# Patient Record
Sex: Male | Born: 1985 | Race: Black or African American | Hispanic: No | Marital: Single | Smoking: Current every day smoker
Health system: Southern US, Community
[De-identification: ages and names within clinical notes are randomized; demographics above are authoritative.]

---
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
## 2010-08-17 ENCOUNTER — Emergency Department (HOSPITAL_COMMUNITY)
Admission: EM | Admit: 2010-08-17 | Discharge: 2010-08-18 | Disposition: A | Discharge status: Home / Self Care | Payer: Managed Care, Other (non HMO) | Attending: Emergency Medicine | Admitting: Emergency Medicine

## 2010-08-17 DIAGNOSIS — K089 Disorder of teeth and supporting structures, unspecified: Secondary | ICD-10-CM | POA: Insufficient documentation

## 2010-08-17 DIAGNOSIS — K029 Dental caries, unspecified: Secondary | ICD-10-CM | POA: Insufficient documentation

## 2018-02-11 ENCOUNTER — Emergency Department (HOSPITAL_COMMUNITY)
Admission: EM | Admit: 2018-02-11 | Discharge: 2018-02-11 | Disposition: A | Discharge status: Home / Self Care | Payer: Medicaid Other | Attending: Emergency Medicine | Admitting: Emergency Medicine

## 2018-02-11 ENCOUNTER — Emergency Department (HOSPITAL_COMMUNITY): Payer: Medicaid Other

## 2018-02-11 ENCOUNTER — Encounter (HOSPITAL_COMMUNITY): Payer: Self-pay

## 2018-02-11 DIAGNOSIS — R51 Headache: Secondary | ICD-10-CM | POA: Diagnosis not present

## 2018-02-11 DIAGNOSIS — F17200 Nicotine dependence, unspecified, uncomplicated: Secondary | ICD-10-CM | POA: Insufficient documentation

## 2018-02-11 DIAGNOSIS — R05 Cough: Secondary | ICD-10-CM | POA: Diagnosis not present

## 2018-02-11 DIAGNOSIS — R509 Fever, unspecified: Secondary | ICD-10-CM | POA: Diagnosis present

## 2018-02-11 DIAGNOSIS — M7918 Myalgia, other site: Secondary | ICD-10-CM | POA: Insufficient documentation

## 2018-02-11 DIAGNOSIS — R6889 Other general symptoms and signs: Secondary | ICD-10-CM

## 2018-02-11 DIAGNOSIS — R109 Unspecified abdominal pain: Secondary | ICD-10-CM | POA: Diagnosis not present

## 2018-02-11 DIAGNOSIS — J111 Influenza due to unidentified influenza virus with other respiratory manifestations: Secondary | ICD-10-CM | POA: Diagnosis not present

## 2018-02-11 LAB — INFLUENZA PANEL BY PCR (TYPE A & B)
INFLAPCR: NEGATIVE
INFLBPCR: NEGATIVE

## 2018-02-11 MED ORDER — BENZONATATE 100 MG PO CAPS
100.0000 mg | ORAL_CAPSULE | Freq: Once | ORAL | Status: AC
  Administered 2018-02-11: 100 mg via ORAL
  Filled 2018-02-11: qty 1

## 2018-02-11 MED ORDER — OSELTAMIVIR PHOSPHATE 75 MG PO CAPS: 75.0000 mg | ORAL_CAPSULE | Freq: Two times a day (BID) | ORAL | 0 refills | Status: DC

## 2018-02-11 MED ORDER — IBUPROFEN 800 MG PO TABS
800.0000 mg | ORAL_TABLET | Freq: Once | ORAL | Status: AC
  Administered 2018-02-11: 800 mg via ORAL
  Filled 2018-02-11: qty 1

## 2018-02-11 MED ORDER — BENZONATATE 100 MG PO CAPS: 100.0000 mg | ORAL_CAPSULE | Freq: Three times a day (TID) | ORAL | 0 refills | Status: DC

## 2018-02-11 NOTE — ED Notes (Signed)
Patient transported to X-ray 

## 2018-02-11 NOTE — ED Notes (Signed)
Pt stable, ambulatory, states understanding of discharge instructions 

## 2018-02-11 NOTE — Discharge Instructions (Signed)
Your symptoms are suggestive of flu.  Take tamiflu as prescribed, take tessalon for cough.  Drink plenty of fluid, alternate between tylenol and ibuprofen for fever and body aches.

## 2018-02-11 NOTE — ED Triage Notes (Signed)
Pt reports yesterday started headache, body pains, dry cough and runny nose

## 2018-02-11 NOTE — ED Provider Notes (Signed)
[facility] Note   CSN: [id number] Arrival date & time: 02/11/18  1930     History   Chief Complaint No chief complaint on file.   HPI Dennis Tran is a 32 y.o. male.  The history is provided by the patient. No language interpreter was used.     Patient reports since yesterday he developed fever, chills, headache, body aches, dry cough, sinus congestion and abdominal discomfort.  Symptom is moderate in severity not improving with home remedy.  He denies any recent sick contact.  He has not had his flu shot.  He denies neck stiffness, nausea vomiting diarrhea or dysuria or rash.  He did not travel outside the country.  He is a smoker.  History reviewed. No pertinent past medical history.  There are no active problems to display for this patient.   History reviewed. No pertinent surgical history.      Home Medications    Prior to Admission medications   Not on File    Family History History reviewed. No pertinent family history.  Social History Social History   Tobacco Use  . Smoking status: Current Every Day Smoker  . Smokeless tobacco: Never Used  Substance Use Topics  . Alcohol use: Never    Frequency: Never  . Drug use: Never     Allergies   Patient has no known allergies.   Review of Systems Review of Systems  All other systems reviewed and are negative.    Physical Exam Updated Vital Signs BP (!) 145/83 (BP Location: Right Arm)   Pulse (!) 113   Temp 99.3 F (37.4 C) (Oral)   Resp 17   Ht 6' (1.829 m)   Wt 90.7 kg   SpO2 97%   BMI 27.12 kg/m   Physical Exam  Constitutional: He is oriented to person, place, and time. He appears well-developed and well-nourished. No distress.  HENT:  Head: Atraumatic.  Right Ear: External ear normal.  Left Ear: External ear normal.  Nose: Nose normal.  Mouth/Throat: Oropharynx is clear and moist.  Eyes: Conjunctivae are normal.  Neck: Normal range  of motion. Neck supple.  No nuchal rigidity  Cardiovascular:  Tachycardia without murmur rubs or gallops  Pulmonary/Chest: No stridor. He has no wheezes.  Scattered rhonchi heard  Abdominal: Soft.  Neurological: He is alert and oriented to person, place, and time.  Skin: No rash noted.  Psychiatric: He has a normal mood and affect.  Nursing note and vitals reviewed.    ED Treatments / Results  Labs (all labs ordered are listed, but only abnormal results are displayed) Labs Reviewed  INFLUENZA PANEL BY PCR (TYPE A & B)    EKG None  Radiology Dg Chest 2 View  Result Date: 02/11/2018 CLINICAL DATA:  Cough, fever, body aches and chest pain. EXAM: CHEST - 2 VIEW COMPARISON:  None. FINDINGS: Cardiomediastinal silhouette is normal. No pleural effusions or focal consolidations. Trachea projects midline and there is no pneumothorax. Soft tissue planes and included osseous structures are non-suspicious. IMPRESSION: Negative. Electronically Signed   By: Awilda Metroourtnay  Bloomer M.D.   On: 02/11/2018 20:39    Procedures Procedures (including critical care time)  Medications Ordered in ED Medications  ibuprofen (ADVIL,MOTRIN) tablet 800 mg (800 mg Oral Given 02/11/18 2013)  benzonatate (TESSALON) capsule 100 mg (100 mg Oral Given 02/11/18 2013)     Initial Impression / Assessment and Plan / ED Course  I have reviewed the triage vital signs and  the nursing notes.  Pertinent labs & imaging results that were available during my care of the patient were reviewed by me and considered in my medical decision making (see chart for details).     BP (!) 145/83 (BP Location: Right Arm)   Pulse (!) 113   Temp 99.3 F (37.4 C) (Oral)   Resp 17   Ht 6' (1.829 m)   Wt 90.7 kg   SpO2 97%   BMI 27.12 kg/m    Final Clinical Impressions(s) / ED Diagnoses   Final diagnoses:  Flu-like symptoms    ED Discharge Orders         Ordered    oseltamivir (TAMIFLU) 75 MG capsule  Every 12 hours       02/11/18 2128    benzonatate (TESSALON) 100 MG capsule  Every 8 hours     02/11/18 2128         8:09 PM Patient here with flulike symptoms. Flu testing sent, CXR ordered.  9:30 PM CXR unremarkable, since pt is in the window of treatment, will prescribe tamiflu.    Fayrene Helper, PA-C 02/11/18 2130    Raeford Razor, MD 02/12/18 1259

## 2018-12-30 ENCOUNTER — Ambulatory Visit (INDEPENDENT_AMBULATORY_CARE_PROVIDER_SITE_OTHER): Payer: Medicaid Other

## 2018-12-30 ENCOUNTER — Encounter (HOSPITAL_COMMUNITY): Payer: Self-pay | Admitting: Emergency Medicine

## 2018-12-30 ENCOUNTER — Other Ambulatory Visit: Payer: Self-pay

## 2018-12-30 ENCOUNTER — Ambulatory Visit (HOSPITAL_COMMUNITY)
Admission: EM | Admit: 2018-12-30 | Discharge: 2018-12-30 | Disposition: A | Discharge status: Home / Self Care | Payer: Medicaid Other | Attending: Family Medicine | Admitting: Family Medicine

## 2018-12-30 DIAGNOSIS — M25511 Pain in right shoulder: Secondary | ICD-10-CM

## 2018-12-30 NOTE — ED Triage Notes (Signed)
Pt sts right arm pain after getting splinter in right leg and having some swelling and pain

## 2018-12-30 NOTE — ED Provider Notes (Signed)
[facility]    CSN: [id number] Arrival date & time: 12/30/18  1456      History   Chief Complaint Chief Complaint  Patient presents with  . Arm Pain    HPI Dennis Tran is a 33 y.o. male.   Patient is a 33 year old male with no significant past medical history.  He presents today with right shoulder pain.  This started after arm was forced back by a 50 pound roll of plastic wrap.  Symptoms have been constant, waxing and waning.  Limited range of motion of shoulder.  No swelling of the shoulder or deformities.  He has been taken Tylenol with some relief of the pain.  Denies any radiation of pain, numbness, tingling or weakness in the extremity.  ROS per HPI    Arm Pain    History reviewed. No pertinent past medical history.  There are no active problems to display for this patient.   History reviewed. No pertinent surgical history.     Home Medications    Prior to Admission medications   Not on File    Family History Family History  Problem Relation Age of Onset  . Healthy Mother     Social History Social History   Tobacco Use  . Smoking status: Current Every Day Smoker  . Smokeless tobacco: Never Used  Substance Use Topics  . Alcohol use: Never    Frequency: Never  . Drug use: Never     Allergies   Patient has no known allergies.   Review of Systems Review of Systems   Physical Exam Triage Vital Signs ED Triage Vitals  Enc Vitals Group     BP 12/30/18 1518 122/77     Pulse Rate 12/30/18 1518 75     Resp 12/30/18 1518 18     Temp 12/30/18 1518 98.1 F (36.7 C)     Temp Source 12/30/18 1518 Oral     SpO2 12/30/18 1518 99 %     Weight --      Height --      Head Circumference --      Peak Flow --      Pain Score 12/30/18 1519 5     Pain Loc --      Pain Edu? --      Excl. in GC? --    No data found.  Updated Vital Signs BP 122/77 (BP Location: Left Arm)   Pulse 75   Temp 98.1 F (36.7 C) (Oral)   Resp 18    SpO2 99%   Visual Acuity Right Eye Distance:   Left Eye Distance:   Bilateral Distance:    Right Eye Near:   Left Eye Near:    Bilateral Near:     Physical Exam Vitals signs and nursing note reviewed.  Constitutional:      Appearance: Normal appearance.  HENT:     Head: Normocephalic and atraumatic.     Nose: Nose normal.  Eyes:     Conjunctiva/sclera: Conjunctivae normal.  Neck:     Musculoskeletal: Normal range of motion.  Pulmonary:     Effort: Pulmonary effort is normal.  Musculoskeletal: Normal range of motion.        General: No swelling.     Comments: Mild TTP to right anterior shoulder. No swelling.  Good strength in extremity with strong radial pulse.  Normal temperature and color. Able to wiggle digits  Skin:    General: Skin is warm and dry.  Findings: No rash.  Neurological:     General: No focal deficit present.     Mental Status: He is alert.  Psychiatric:        Mood and Affect: Mood normal.      UC Treatments / Results  Labs (all labs ordered are listed, but only abnormal results are displayed) Labs Reviewed - No data to display  EKG   Radiology Dg Shoulder Right  Result Date: 12/30/2018 CLINICAL DATA:  Per pt: three weeks ago, at work, a 50lb roll of plastic wrap was moving it and the right shoulder caught all the weight of the roll. Has been having pain in the right shoulder since. EXAM: RIGHT SHOULDER - 2+ VIEW COMPARISON:  None. FINDINGS: There is no evidence of fracture or dislocation. There is no evidence of arthropathy or other focal bone abnormality. Soft tissues are unremarkable. Partially visualized adjacent right lung and ribs are unremarkable. IMPRESSION: Negative right shoulder radiographs. Electronically Signed   By: Audie Pinto M.D.   On: 12/30/2018 16:19    Procedures Procedures (including critical care time)  Medications Ordered in UC Medications - No data to display  Initial Impression / Assessment and Plan / UC  Course  I have reviewed the triage vital signs and the nursing notes.  Pertinent labs & imaging results that were available during my care of the patient were reviewed by me and considered in my medical decision making (see chart for details).     Right shoulder pain-  X-ray negative most likely shoulder strain or ligamentous tear.  Most likely heal on its own with time. He can continue the Tylenol or take ibuprofen for pain. Recommended if symptoms do not improve he will need to follow-up with orthopedics. Final Clinical Impressions(s) / UC Diagnoses   Final diagnoses:  Acute pain of right shoulder     Discharge Instructions     Your x ray was normal You can continue the tylenol You can also try ibuprofen as needed.  Rest, ice the shoulder If not better in the next few weeks you may want to see orthopedics.  Could be a small rotator cuff tear. These usually resolve on their own in 6 weeks.     ED Prescriptions    None     PDMP not reviewed this encounter.   Orvan July, NP 12/30/18 1653

## 2018-12-30 NOTE — Discharge Instructions (Signed)
Your x ray was normal You can continue the tylenol You can also try ibuprofen as needed.  Rest, ice the shoulder If not better in the next few weeks you may want to see orthopedics.  Could be a small rotator cuff tear. These usually resolve on their own in 6 weeks.

## 2020-05-31 IMAGING — CR DG CHEST 2V
2 series · 2 of 2 positions shown · non-contrast
Comparison: None.

CLINICAL DATA: Cough, fever, body aches and chest pain.

EXAM:
CHEST - 2 VIEW

[chest pa]
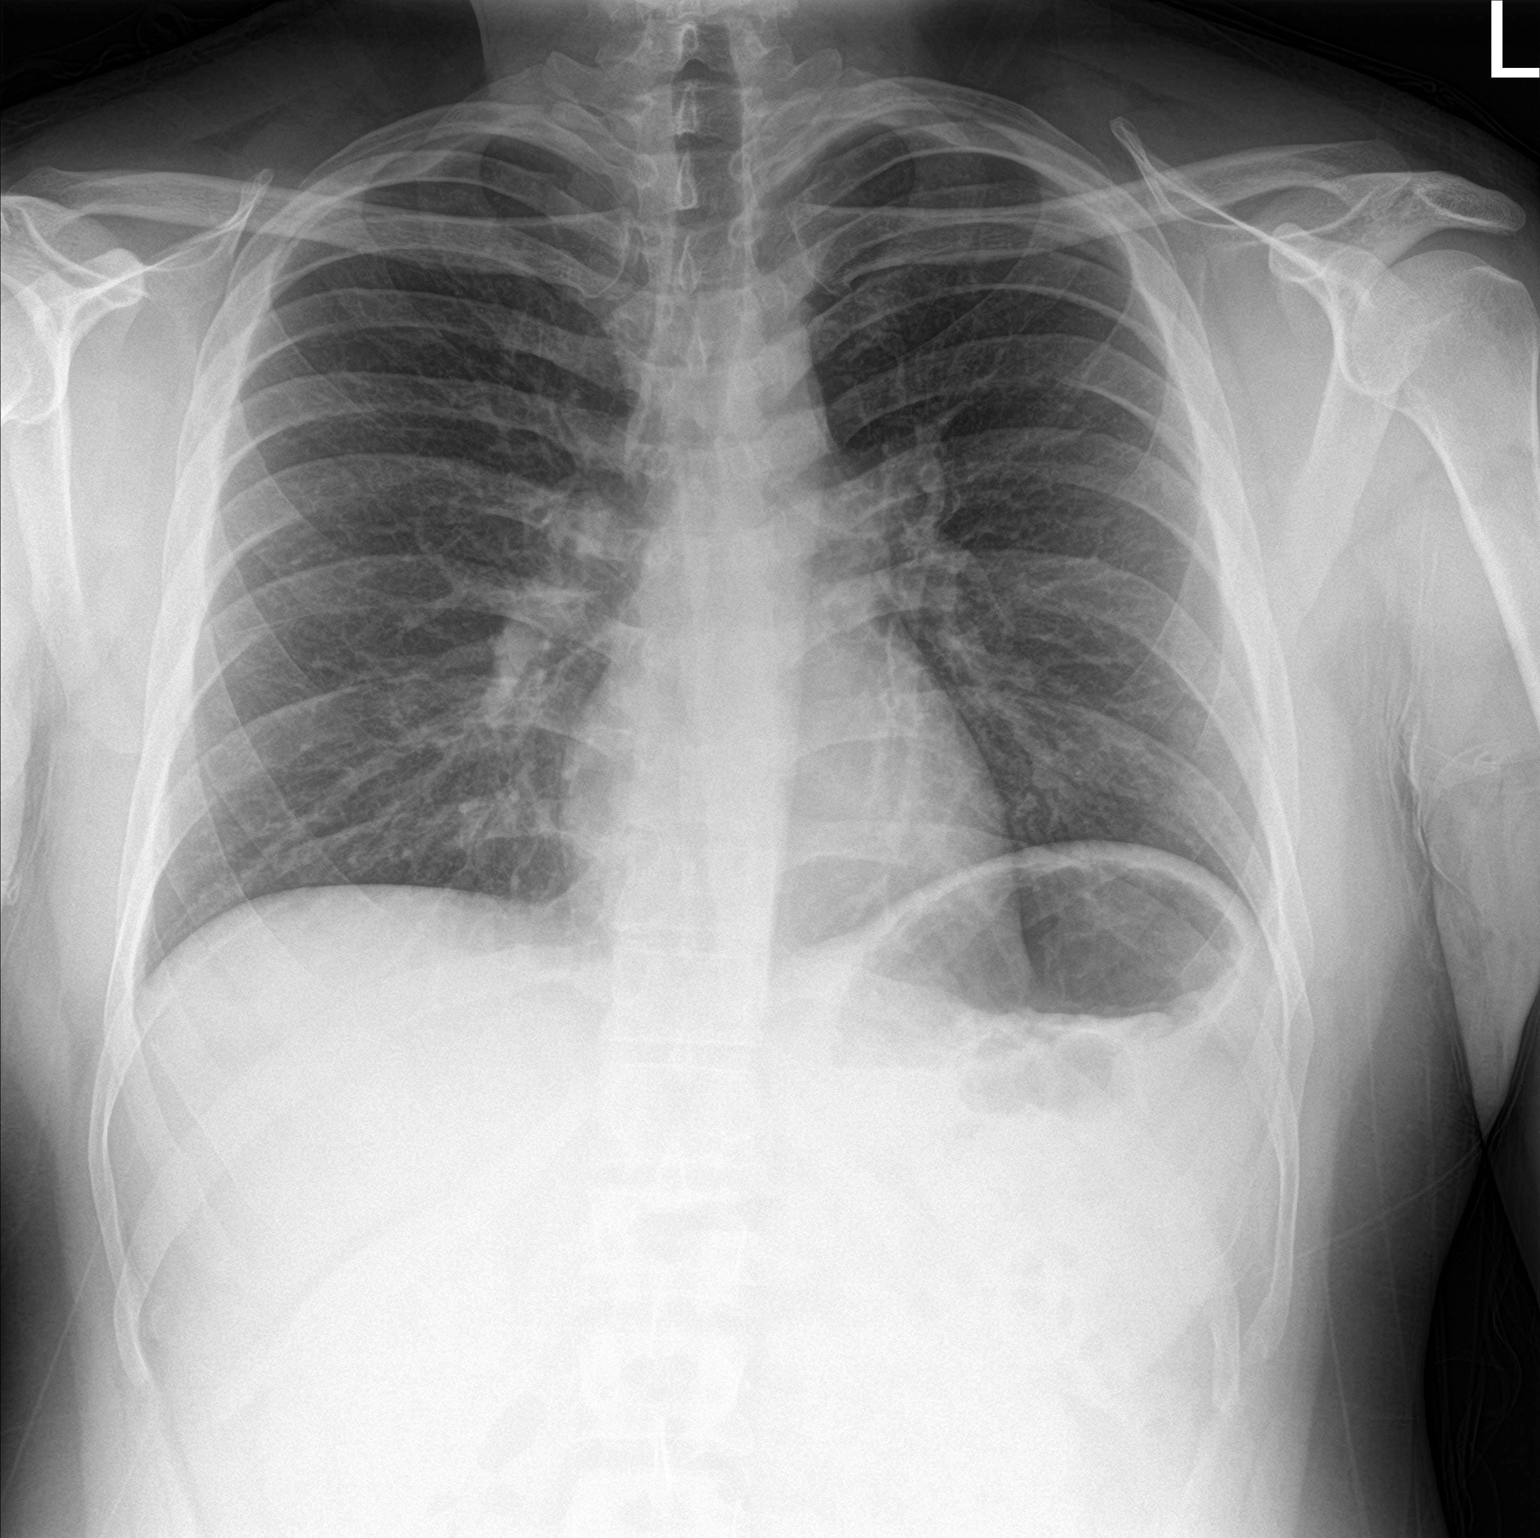

[chest lat]
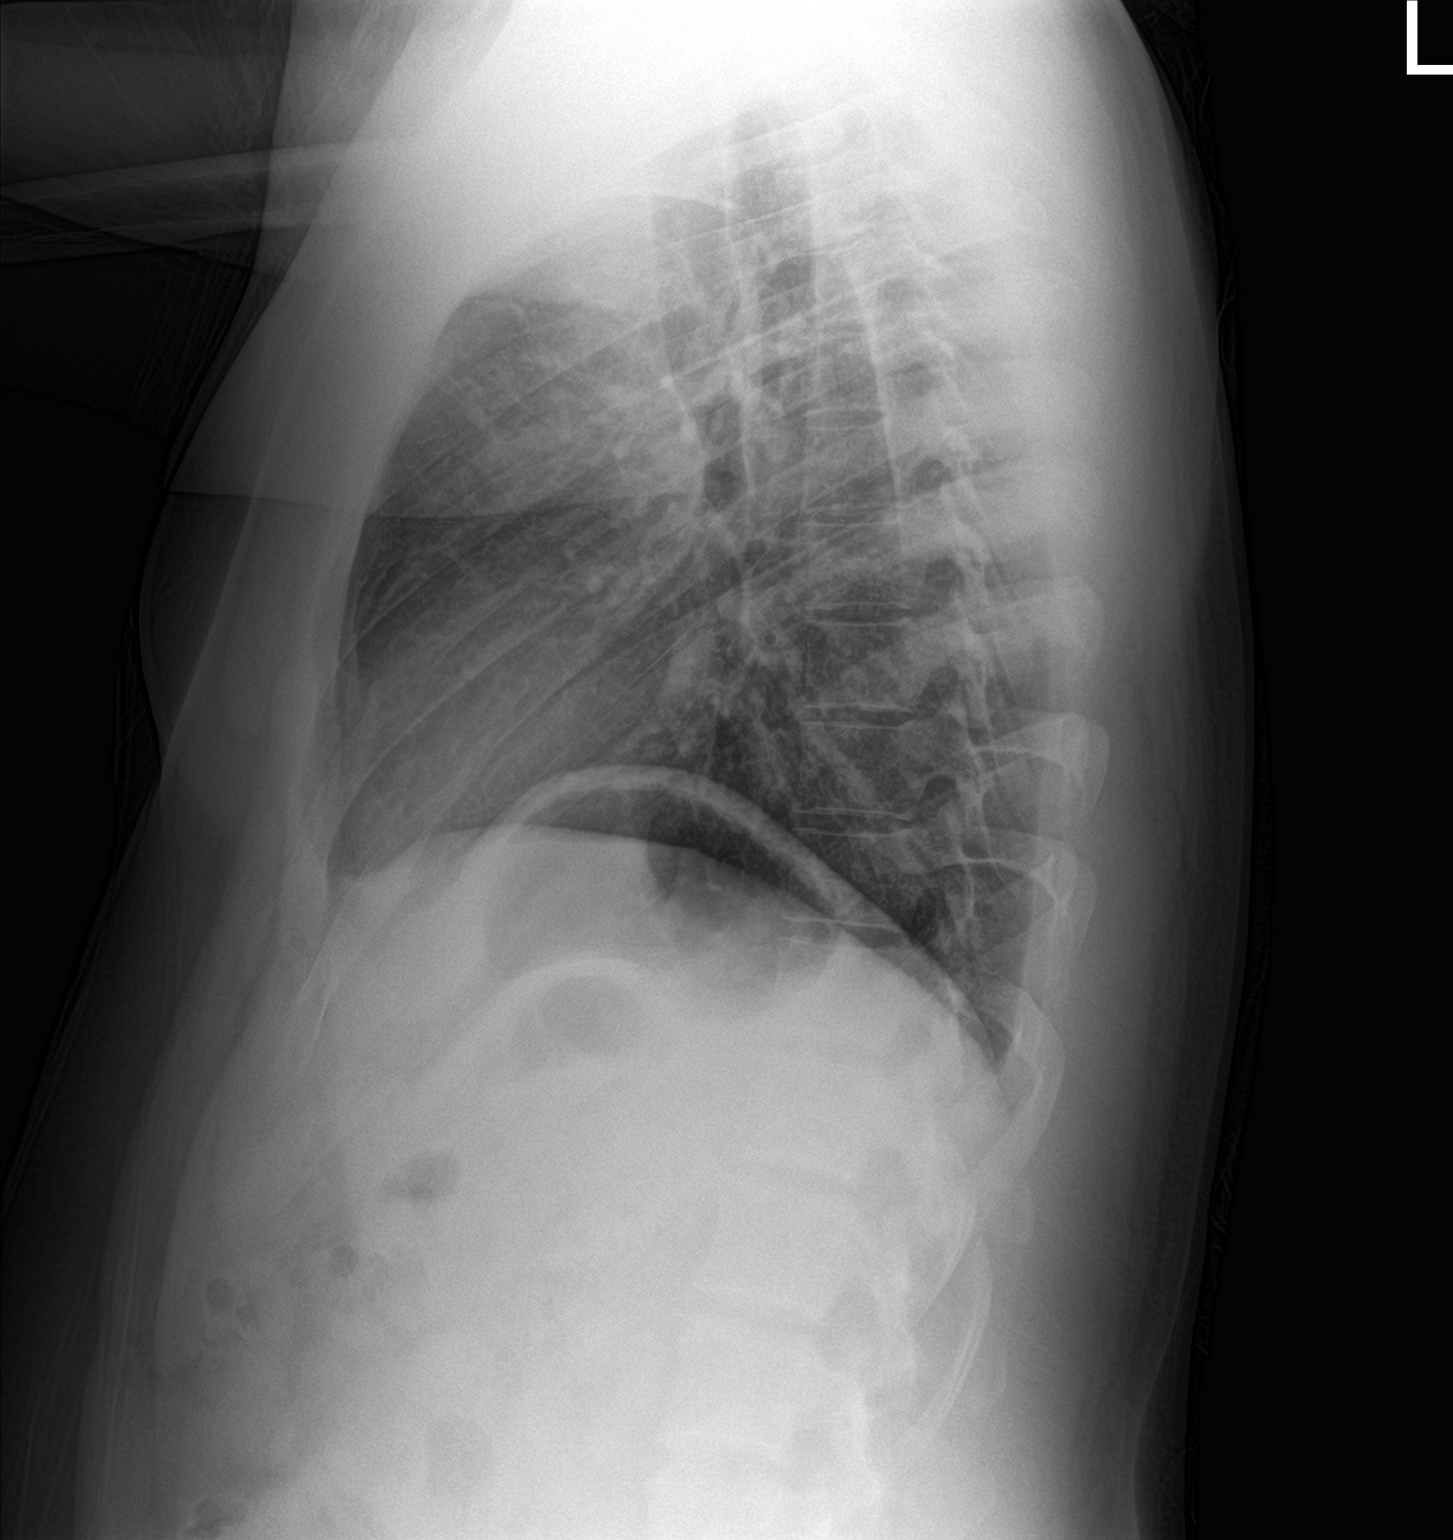

[2 of 2 positions shown; findings below may reference images not displayed]

FINDINGS: Cardiomediastinal silhouette is normal. No pleural effusions or
focal consolidations. Trachea projects midline and there is no
pneumothorax. Soft tissue planes and included osseous structures are
non-suspicious.
IMPRESSION: Negative.

## 2020-09-25 ENCOUNTER — Other Ambulatory Visit: Payer: Self-pay

## 2020-09-25 ENCOUNTER — Encounter (INDEPENDENT_AMBULATORY_CARE_PROVIDER_SITE_OTHER): Payer: Self-pay | Admitting: Ophthalmology

## 2020-09-25 ENCOUNTER — Ambulatory Visit (INDEPENDENT_AMBULATORY_CARE_PROVIDER_SITE_OTHER): Payer: Medicaid Other | Admitting: Ophthalmology

## 2020-09-25 DIAGNOSIS — H33322 Round hole, left eye: Secondary | ICD-10-CM

## 2020-09-25 DIAGNOSIS — H3581 Retinal edema: Secondary | ICD-10-CM | POA: Diagnosis not present

## 2020-09-25 DIAGNOSIS — H35412 Lattice degeneration of retina, left eye: Secondary | ICD-10-CM | POA: Diagnosis not present

## 2020-09-25 MED ORDER — PREDNISOLONE ACETATE 1 % OP SUSP: 1.0000 [drp] | Freq: Four times a day (QID) | OPHTHALMIC | 0 refills | Status: AC

## 2020-09-25 NOTE — Progress Notes (Signed)
[facility] - Clinic Note  09/25/2020     CHIEF COMPLAINT Patient presents for Retina Evaluation   HISTORY OF PRESENT ILLNESS: Dennis Tran is a 35 y.o. male who presents to the clinic today for:   HPI     Retina Evaluation   In left eye.  This started 3 months ago.  Duration of 3 months.  Associated Symptoms Floaters.  Context:  distance vision and near vision.  I, the attending physician,  performed the HPI with the patient and updated documentation appropriately.        Comments   Pt went in for routine eye exam yesterday with Dr. Dione Booze and states they found something in his left eye.  Pt reports 2 year history of floaters OS that have gotten increasingly worse x 3 months.  Pt states he sees much better out of his contact lenses than glasses--has not filled Rx for glasses from Dr. Conley Rolls from 06/2020.  Pt states his prescription fluctuates a lot.  Pt states Dr. Laruth Bouchard office checked MRx but did not provide an updated Rx yesterday. 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  States he is always extremely light sensitive.  Pt wears sunglasses but wants to discuss possibility of having windows tinted in vehicle to reduce sun exposure.   Pt was on QHS gtt for glaucoma approx 2 months ago (has not used in 2 months.  Reports teal top--?latanoprost).  Pt states at time of treating glaucoma, he was only asked to use drop in the left eye.  Pt has +Fam Hx of glaucoma in siblings.      Last edited by Rennis Chris, MD on 09/25/2020  1:07 PM.    Pt is here on the referral of Dr. Dione Booze for concern of retinal tear OS, pt saw Dr. Dione Booze for a glaucoma exam, pt previously saw Dr. Wynelle Link for glaucoma, but he no longer sees her due to insurance reason, pt states she put him on latanoprost QHS, pt ran out of latanoprost 2 months ago, so he states Dr. Dione Booze is unsure if he actually has glaucoma or not, pt has no other health or eye problems, pt states he has noticed floaters x2  months  Referring physician: Olivia Canter, MD [address] [place],  [place] [address]  HISTORICAL INFORMATION:   Selected notes from the MEDICAL RECORD NUMBER Referred by Dr. Laruth Bouchard for lattice with holes OS LEE:  Ocular Hx- PMH-    CURRENT MEDICATIONS: Current Outpatient Medications (Ophthalmic Drugs)  Medication Sig   prednisoLONE acetate (PRED FORTE) 1 % ophthalmic suspension Place 1 drop into the left eye 4 (four) times daily for 7 days.   No current facility-administered medications for this visit. (Ophthalmic Drugs)   No current outpatient medications on file. (Other)   No current facility-administered medications for this visit. (Other)      REVIEW OF SYSTEMS: ROS   Positive for: Eyes Negative for: Constitutional, Gastrointestinal, Neurological, Skin, Genitourinary, Musculoskeletal, HENT, Endocrine, Cardiovascular, Respiratory, Psychiatric, Allergic/Imm, Heme/Lymph Last edited by Corrinne Eagle on 09/25/2020  8:51 AM.       ALLERGIES No Known Allergies  PAST MEDICAL HISTORY History reviewed. No pertinent past medical history. History reviewed. No pertinent surgical history.  FAMILY HISTORY Family History  Problem Relation Age of Onset   Healthy Mother     SOCIAL HISTORY Social History   Tobacco Use   Smoking status: Every Day    Pack years: 0.00  Smokeless tobacco: Never  Substance Use Topics   Alcohol use: Never   Drug use: Never         OPHTHALMIC EXAM:  Base Eye Exam     Visual Acuity (Snellen - Linear)       Right Left   Dist cc 20/20 -2 20/30 +2   Dist ph cc  20/25 -2    Correction: Glasses         Tonometry (Tonopen, 9:19 AM)       Right Left   Pressure 17 17         Pupils       Dark Light Shape React APD   Right 3 2 Round Brisk 0   Left 3 2 Round Brisk 0         Visual Fields       Left Right    Full Full         Extraocular Movement       Right Left    Full Full          Neuro/Psych     Oriented x3: Yes   Mood/Affect: Normal         Dilation     Both eyes: 1.0% Mydriacyl, 2.5% Phenylephrine @ 9:19 AM           Slit Lamp and Fundus Exam     Slit Lamp Exam       Right Left   Lids/Lashes Dermatochalasis - upper lid Dermatochalasis - upper lid   Conjunctiva/Sclera mild melanosis mild melanosis   Cornea Clear Clear   Anterior Chamber Deep and quiet Deep and quiet   Iris Round and dilated Round and dilated   Lens Clear Clear   Vitreous Mild Vitreous syneresis Mild Vitreous syneresis         Fundus Exam       Right Left   Disc Pink and Sharp, +cupping, central pallor Pink and Sharp, +cupping, central pallor   C/D Ratio 0.7 0.65   Macula Flat, Good foveal reflex, No heme or edema Flat, Good foveal reflex, No heme or edema   Vessels mild attenuation mild attenuation   Periphery Attached, mild IT White without pressure, No RT/RD/lattice Attached, mild lattice 02-1229, pigmented lattice at [id number] with atrophic hole at 0530, so SRF, No other RT/RD           Refraction     Manifest Refraction       Sphere Cylinder Axis Dist VA   Right -5.25 +1.25 090 20/20-2   Left -6.00 +2.00 085 20/25-2            IMAGING AND PROCEDURES  Imaging and Procedures for 09/25/2020  OCT, Retina - OU - Both Eyes       Right Eye Quality was good. Central Foveal Thickness: 281. Progression has no prior data. Findings include normal foveal contour, no IRF, no SRF, myopic contour.   Left Eye Quality was good. Central Foveal Thickness: 298. Progression has no prior data. Findings include normal foveal contour, no IRF, no SRF, myopic contour.   Notes *Images captured and stored on drive  Diagnosis / Impression:  NFP; no IRF/SRF OU  Clinical management:  See below  Abbreviations: NFP - Normal foveal profile. CME - cystoid macular edema. PED - pigment epithelial detachment. IRF - intraretinal fluid. SRF - subretinal fluid. EZ - ellipsoid zone.  ERM - epiretinal membrane. ORA - outer retinal atrophy. ORT - outer retinal tubulation. SRHM - subretinal hyper-reflective material.  IRHM - intraretinal hyper-reflective material      Repair Retinal Breaks, Laser - OS - Left Eye       LASER PROCEDURE NOTE  Procedure:  Barrier laser retinopexy using slit lamp laser, LEFT eye   Diagnosis:   Lattice degeneration w/ atrophic holes, LEFT eye                     Patches of lattice: 1200-1230 superiorly; 0330-0530 inferiorly (hole at 0530)  Surgeon: Rennis Chris, MD, PhD  Anesthesia: Topical  Informed consent obtained, operative eye marked, and time out performed prior to initiation of laser.   Laser settings:  Lumenis [id number] laser, slit lamp Lens: Mainster PRP 165 Power: 250 mW Spot size: 200 microns Duration: 30 msec  # spots: 599  Placement of laser: Using a Mainster PRP 165 contact lens at the slit lamp, laser was placed in three confluent rows around patches of lattice w/ atrophic holes from 02-1229 and 330-530 oclock anterior to equator.  Complications: None.  Patient tolerated the procedure well and received written and verbal post-procedure care information/education.              ASSESSMENT/PLAN:    ICD-10-CM   1. Lattice degeneration of left retina  H35.412 Repair Retinal Breaks, Laser - OS - Left Eye    2. Retinal hole, left  H33.322 Repair Retinal Breaks, Laser - OS - Left Eye    3. Retinal edema  H35.81 OCT, Retina - OU - Both Eyes      1,2. Lattice degeneration w/ atrophic holes, left eye - lattice from 02-1229, pigmented lattice at [phone] with atrophic hole at 0530 - discussed findings, prognosis, and treatment options including observation - recommend laser retinopexy OS today, 07.08.22 - pt wishes to proceed with laser - RBA of procedure discussed, questions answered - informed consent obtained and signed - see procedure note - start PF QID OS x7 days - f/u in 2-3 wks, sooner prn --  POV   Ophthalmic Meds Ordered this visit:  Meds ordered this encounter  Medications   prednisoLONE acetate (PRED FORTE) 1 % ophthalmic suspension    Sig: Place 1 drop into the left eye 4 (four) times daily for 7 days.    Dispense:  10 mL    Refill:  0      Return for 2-3 wks - f/u lattice post laser OS.  There are no Patient Instructions on file for this visit.   Explained the diagnoses, plan, and follow up with the patient and they expressed understanding.  Patient expressed understanding of the importance of proper follow up care.   This document serves as a record of services personally performed by Karie Chimera, MD, PhD. It was created on their behalf by Glee Arvin. Manson Passey, OA an ophthalmic technician. The creation of this record is the provider's dictation and/or activities during the visit.    Electronically signed by: Glee Arvin. Manson Passey, [place] 07.08.2022 12:16 AM  Karie Chimera, M.D., Ph.D. Diseases & Surgery of the Retina and Vitreous [facility]  I have reviewed the above documentation for accuracy and completeness, and I agree with the above. Karie Chimera, M.D., Ph.D. 09/28/20 12:16 AM  Abbreviations: M myopia (nearsighted); A astigmatism; H hyperopia (farsighted); P presbyopia; Mrx spectacle prescription;  CTL contact lenses; OD right eye; OS left eye; OU both eyes  XT exotropia; ET esotropia; PEK punctate epithelial keratitis; PEE punctate epithelial erosions; DES dry eye syndrome;  MGD meibomian gland dysfunction; ATs artificial tears; PFAT's preservative free artificial tears; NSC nuclear sclerotic cataract; PSC posterior subcapsular cataract; ERM epi-retinal membrane; PVD posterior vitreous detachment; RD retinal detachment; DM diabetes mellitus; DR diabetic retinopathy; NPDR non-proliferative diabetic retinopathy; PDR proliferative diabetic retinopathy; CSME clinically significant macular edema; DME diabetic macular edema; dbh dot blot hemorrhages;  CWS cotton wool spot; POAG primary open angle glaucoma; C/D cup-to-disc ratio; HVF humphrey visual field; GVF goldmann visual field; OCT optical coherence tomography; IOP intraocular pressure; BRVO Branch retinal vein occlusion; CRVO central retinal vein occlusion; CRAO central retinal artery occlusion; BRAO branch retinal artery occlusion; RT retinal tear; SB scleral buckle; PPV pars plana vitrectomy; VH Vitreous hemorrhage; PRP panretinal laser photocoagulation; IVK intravitreal kenalog; VMT vitreomacular traction; MH Macular hole;  NVD neovascularization of the disc; NVE neovascularization elsewhere; AREDS age related eye disease study; ARMD age related macular degeneration; POAG primary open angle glaucoma; EBMD epithelial/anterior basement membrane dystrophy; ACIOL anterior chamber intraocular lens; IOL intraocular lens; PCIOL posterior chamber intraocular lens; Phaco/IOL phacoemulsification with intraocular lens placement; PRK photorefractive keratectomy; LASIK laser assisted in situ keratomileusis; HTN hypertension; DM diabetes mellitus; COPD chronic obstructive pulmonary disease

## 2020-10-13 NOTE — Progress Notes (Signed)
[facility] - Clinic Note  10/16/2020     CHIEF COMPLAINT Patient presents for Retina Follow Up   HISTORY OF PRESENT ILLNESS: Dennis Tran is a 35 y.o. male who presents to the clinic today for:   HPI     Retina Follow Up   Patient presents with  Other.  I, the attending physician,  performed the HPI with the patient and updated documentation appropriately.        Comments   Pt states his vision is slightly better OS since laser.  Pt has appt with Dr. Dione Booze coming up to discuss prior history of glaucoma and use of pressure drops.  Pt denies eye pain or discomfort.      Last edited by Rennis Chris, MD on 10/16/2020 11:33 PM.      Referring physician: Olivia Canter, MD [address] [place],  [place] [address]  HISTORICAL INFORMATION:   Selected notes from the MEDICAL RECORD NUMBER Referred by Dr. Laruth Bouchard for lattice with holes OS   CURRENT MEDICATIONS: No current outpatient medications on file. (Ophthalmic Drugs)   No current facility-administered medications for this visit. (Ophthalmic Drugs)   No current outpatient medications on file. (Other)   No current facility-administered medications for this visit. (Other)      REVIEW OF SYSTEMS: ROS   Positive for: Eyes Negative for: Constitutional, Gastrointestinal, Neurological, Skin, Genitourinary, Musculoskeletal, HENT, Endocrine, Cardiovascular, Respiratory, Psychiatric, Allergic/Imm, Heme/Lymph Last edited by Corrinne Eagle on 10/16/2020  1:35 PM.        ALLERGIES No Known Allergies  PAST MEDICAL HISTORY History reviewed. No pertinent past medical history. History reviewed. No pertinent surgical history.  FAMILY HISTORY Family History  Problem Relation Age of Onset   Healthy Mother     SOCIAL HISTORY Social History   Tobacco Use   Smoking status: Every Day   Smokeless tobacco: Never  Substance Use Topics   Alcohol use: Never   Drug use: Never          OPHTHALMIC EXAM:  Base Eye Exam     Visual Acuity (Snellen - Linear)       Right Left   Dist cc 20/20 -2 20/30 -2   Dist ph cc  20/25 +1         Tonometry (Tonopen, 1:40 PM)       Right Left   Pressure 20 20         Pupils       Dark Light Shape React APD   Right 4 3 Round Brisk 0   Left 4 3 Round Brisk 0         Visual Fields       Left Right    Full Full         Extraocular Movement       Right Left    Full Full         Neuro/Psych     Oriented x3: Yes   Mood/Affect: Normal         Dilation     Both eyes: 1.0% Mydriacyl, 2.5% Phenylephrine @ 1:40 PM           Slit Lamp and Fundus Exam     Slit Lamp Exam       Right Left   Lids/Lashes Dermatochalasis - upper lid Dermatochalasis - upper lid   Conjunctiva/Sclera mild melanosis mild melanosis   Cornea Clear Clear   Anterior Chamber Deep and quiet  Deep and quiet   Iris Round and dilated Round and dilated   Lens Clear Clear   Vitreous Mild Vitreous syneresis Mild Vitreous syneresis         Fundus Exam       Right Left   Disc Pink and Sharp, +cupping, central pallor Pink and Sharp, +cupping, central pallor   C/D Ratio 0.7 0.65   Macula Flat, Good foveal reflex, No heme or edema Flat, Good foveal reflex, No heme or edema   Vessels mild attenuation mild attenuation   Periphery Attached, mild IT White without pressure, No RT/RD/lattice Attached, mild lattice 02-1229, pigmented lattice at [phone] with atrophic hole at 0530; early laser changes surrounding -- light in pigmentation and not fully mature, so SRF, No other RT/RD            IMAGING AND PROCEDURES  Imaging and Procedures for 10/16/2020  OCT, Retina - OU - Both Eyes       Right Eye Quality was good. Central Foveal Thickness: 285. Progression has been stable. Findings include normal foveal contour, no IRF, no SRF, myopic contour.   Left Eye Quality was good. Central Foveal Thickness: 289. Progression has been  stable. Findings include normal foveal contour, no IRF, no SRF, myopic contour.   Notes *Images captured and stored on drive  Diagnosis / Impression:  NFP; no IRF/SRF OU  Clinical management:  See below  Abbreviations: NFP - Normal foveal profile. CME - cystoid macular edema. PED - pigment epithelial detachment. IRF - intraretinal fluid. SRF - subretinal fluid. EZ - ellipsoid zone. ERM - epiretinal membrane. ORA - outer retinal atrophy. ORT - outer retinal tubulation. SRHM - subretinal hyper-reflective material. 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 Retinal edema  H35.81 OCT, Retina - OU - Both Eyes     1,2. Lattice degeneration w/ atrophic holes, left eye - lattice from 02-1229, pigmented lattice at [phone] with atrophic hole at 0530 - s/p laser retinopexy OS (07.08.22) - early laser changes in places -- light pigmentation and not fully mature - discussed findings and potential need for touch-up laser, but will monitor for now             - F/u in 6 wks w/DFE&OCT  Ophthalmic Meds Ordered this visit:  No orders of the defined types were placed in this encounter.     Return in about 6 weeks (around 11/27/2020) for 6 wk f/u w/DFE&OCT.  There are no Patient Instructions on file for this visit.  This document serves as a record of services personally performed by Karie Chimera, MD, PhD. It was created on their behalf by Cristopher Estimable, COT an ophthalmic technician. The creation of this record is the provider's dictation and/or activities during the visit.    Electronically signed by: Cristopher Estimable, COT 7.29.22 @ 11:36 PM   Karie Chimera, M.D., Ph.D. Diseases & Surgery of the Retina and Vitreous [facility] 10/16/2020   I have reviewed the above documentation for accuracy and completeness, and I  agree with the above. Karie Chimera, M.D., Ph.D. 10/16/20 11:36 PM  Abbreviations: M myopia (nearsighted); A astigmatism; H hyperopia (farsighted); P presbyopia; Mrx spectacle prescription;  CTL contact lenses; OD right eye; OS left  eye; OU both eyes  XT exotropia; ET esotropia; PEK punctate epithelial keratitis; PEE punctate epithelial erosions; DES dry eye syndrome; MGD meibomian gland dysfunction; ATs artificial tears; PFAT's preservative free artificial tears; NSC nuclear sclerotic cataract; PSC posterior subcapsular cataract; ERM epi-retinal membrane; PVD posterior vitreous detachment; RD retinal detachment; DM diabetes mellitus; DR diabetic retinopathy; NPDR non-proliferative diabetic retinopathy; PDR proliferative diabetic retinopathy; CSME clinically significant macular edema; DME diabetic macular edema; dbh dot blot hemorrhages; CWS cotton wool spot; POAG primary open angle glaucoma; C/D cup-to-disc ratio; HVF humphrey visual field; GVF goldmann visual field; OCT optical coherence tomography; IOP intraocular pressure; BRVO Branch retinal vein occlusion; CRVO central retinal vein occlusion; CRAO central retinal artery occlusion; BRAO branch retinal artery occlusion; RT retinal tear; SB scleral buckle; PPV pars plana vitrectomy; VH Vitreous hemorrhage; PRP panretinal laser photocoagulation; IVK intravitreal kenalog; VMT vitreomacular traction; MH Macular hole;  NVD neovascularization of the disc; NVE neovascularization elsewhere; AREDS age related eye disease study; ARMD age related macular degeneration; POAG primary open angle glaucoma; EBMD epithelial/anterior basement membrane dystrophy; ACIOL anterior chamber intraocular lens; IOL intraocular lens; PCIOL posterior chamber intraocular lens; Phaco/IOL phacoemulsification with intraocular lens placement; PRK photorefractive keratectomy; LASIK laser assisted in situ keratomileusis; HTN hypertension; DM diabetes mellitus; COPD chronic obstructive  pulmonary disease

## 2020-10-16 ENCOUNTER — Other Ambulatory Visit: Payer: Self-pay

## 2020-10-16 ENCOUNTER — Encounter (INDEPENDENT_AMBULATORY_CARE_PROVIDER_SITE_OTHER): Payer: Self-pay | Admitting: Ophthalmology

## 2020-10-16 ENCOUNTER — Ambulatory Visit (INDEPENDENT_AMBULATORY_CARE_PROVIDER_SITE_OTHER): Payer: Medicaid Other | Admitting: Ophthalmology

## 2020-10-16 DIAGNOSIS — H35412 Lattice degeneration of retina, left eye: Secondary | ICD-10-CM

## 2020-10-16 DIAGNOSIS — H33322 Round hole, left eye: Secondary | ICD-10-CM | POA: Diagnosis not present

## 2020-10-16 DIAGNOSIS — H3581 Retinal edema: Secondary | ICD-10-CM

## 2020-11-24 NOTE — Progress Notes (Signed)
[facility] - Clinic Note  11/27/2020     CHIEF COMPLAINT Patient presents for Retina Follow Up   HISTORY OF PRESENT ILLNESS: Dennis Tran is a 35 y.o. male who presents to the clinic today for:   HPI     Retina Follow Up   Patient presents with  Other.  In left eye.  This started 6 weeks ago.  I, the attending physician,  performed the HPI with the patient and updated documentation appropriately.        Comments   Patient here  for 6 weeks retina follow up for lattice OS. Patient states vision has its days. No eye pain. Still see floaters on occasion.       Last edited by Rennis Chris, MD on 11/28/2020 12:45 AM.    Pt states he has floaters every now and then   Referring physician: Olivia Canter, MD [address] [place],  [place] [address]  HISTORICAL INFORMATION:   Selected notes from the MEDICAL RECORD NUMBER Referred by Dr. Laruth Bouchard for lattice with holes OS   CURRENT MEDICATIONS: No current outpatient medications on file. (Ophthalmic Drugs)   No current facility-administered medications for this visit. (Ophthalmic Drugs)   No current outpatient medications on file. (Other)   No current facility-administered medications for this visit. (Other)   REVIEW OF SYSTEMS: ROS   Positive for: Eyes Negative for: Constitutional, Gastrointestinal, Neurological, Skin, Genitourinary, Musculoskeletal, HENT, Endocrine, Cardiovascular, Respiratory, Psychiatric, Allergic/Imm, Heme/Lymph Last edited by Laddie Aquas, COA on 11/27/2020  1:51 PM.     ALLERGIES No Known Allergies  PAST MEDICAL HISTORY History reviewed. No pertinent past medical history. History reviewed. No pertinent surgical history.  FAMILY HISTORY Family History  Problem Relation Age of Onset   Healthy Mother     SOCIAL HISTORY Social History   Tobacco Use   Smoking status: Every Day   Smokeless tobacco: Never  Substance Use Topics   Alcohol use: Never   Drug  use: Never         OPHTHALMIC EXAM:  Base Eye Exam     Visual Acuity (Snellen - Linear)       Right Left   Dist cc 20/20 20/25   Dist ph cc  20/20 -1    Correction: Contacts         Tonometry (Tonopen, 1:49 PM)       Right Left   Pressure 18 16         Pupils       Dark Light Shape React APD   Right 4 3 Round Brisk None   Left 4 3 Round Brisk None         Visual Fields (Counting fingers)       Left Right    Full Full         Extraocular Movement       Right Left    Full, Ortho Full, Ortho         Neuro/Psych     Oriented x3: Yes   Mood/Affect: Normal         Dilation     Both eyes: 1.0% Mydriacyl, 2.5% Phenylephrine @ 1:49 PM           Slit Lamp and Fundus Exam     Slit Lamp Exam       Right Left   Lids/Lashes Dermatochalasis - upper lid Dermatochalasis - upper lid   Conjunctiva/Sclera mild melanosis mild melanosis  Cornea Clear Clear   Anterior Chamber Deep and quiet Deep and quiet   Iris Round and dilated Round and dilated   Lens Clear Clear   Vitreous Mild Vitreous syneresis Mild Vitreous syneresis         Fundus Exam       Right Left   Disc Pink and Sharp, +cupping, central pallor Pink and Sharp, +cupping, central pallor   C/D Ratio 0.7 0.65   Macula Flat, Good foveal reflex, No heme or edema Flat, Good foveal reflex, No heme or edema, RPE mottling   Vessels mild attenuation mild attenuation   Periphery Attached, mild IT White without pressure, No RT/RD/lattice, focal pigment clump 0700 midzone Attached, mild lattice 02-1229, pigmented lattice at [phone] with atrophic hole at 0530; good laser changes surrounding all lesions, no SRF, No other RT/RD            IMAGING AND PROCEDURES  Imaging and Procedures for 11/27/2020  OCT, Retina - OU - Both Eyes       Right Eye Quality was good. Central Foveal Thickness: 290. Progression has been stable. Findings include normal foveal contour, no IRF, no SRF, myopic  contour, vitreomacular adhesion .   Left Eye Quality was good. Central Foveal Thickness: 289. Progression has been stable. Findings include normal foveal contour, no IRF, no SRF, myopic contour, vitreomacular adhesion .   Notes *Images captured and stored on drive  Diagnosis / Impression:  NFP; no IRF/SRF OU  Clinical management:  See below  Abbreviations: NFP - Normal foveal profile. CME - cystoid macular edema. PED - pigment epithelial detachment. IRF - intraretinal fluid. SRF - subretinal fluid. EZ - ellipsoid zone. ERM - epiretinal membrane. ORA - outer retinal atrophy. ORT - outer retinal tubulation. SRHM - subretinal hyper-reflective material. 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 Retinal edema  H35.81 OCT, Retina - OU - Both Eyes     1,2. Lattice degeneration w/ atrophic holes, left eye - lattice from 02-1229, pigmented lattice at [phone] with atrophic hole at 0530 - s/p laser retinopexy OS (07.08.22) -- good laser surrounding all lesions - no new RT/RD             - pt is cleared from a retina standpoint for release to Dr. Fabian Sharp and resumption of primary eye care   Ophthalmic Meds Ordered this visit:  No orders of the defined types were placed in this encounter.     Return if symptoms worsen or fail to improve.  There are no Patient Instructions on file for this visit.  This document serves as a record of services personally performed by Karie Chimera, MD, PhD. It was created on their behalf by Glee Arvin. Manson Passey, OA an ophthalmic technician. The creation of this record is the provider's dictation and/or activities during the visit.    Electronically signed by: Glee Arvin. Manson Passey, [place] 09.06.2022 12:45 AM   Karie Chimera, M.D., Ph.D. Diseases & Surgery of the Retina and Vitreous [facility]  I have reviewed the above documentation for accuracy and completeness, and I agree with the above. Karie Chimera, M.D., Ph.D. 11/28/20 12:46 AM   Abbreviations: M myopia (nearsighted); A astigmatism; H hyperopia (farsighted); P presbyopia; Mrx spectacle prescription;  CTL contact lenses; OD right eye; OS left eye; OU both eyes  XT exotropia; ET esotropia; PEK punctate epithelial keratitis; PEE punctate epithelial erosions; DES dry eye syndrome; MGD meibomian gland dysfunction; ATs artificial tears; PFAT's preservative free artificial tears; NSC nuclear sclerotic cataract; PSC posterior subcapsular cataract; ERM epi-retinal membrane; PVD posterior vitreous detachment; RD retinal detachment; DM diabetes mellitus; DR diabetic retinopathy; NPDR non-proliferative diabetic retinopathy; PDR proliferative diabetic retinopathy; CSME clinically significant macular edema; DME diabetic macular edema; dbh dot blot hemorrhages; CWS cotton wool spot; POAG primary open angle glaucoma; C/D cup-to-disc ratio; HVF humphrey visual field; GVF goldmann visual field; OCT optical coherence tomography; IOP intraocular pressure; BRVO Branch retinal vein occlusion; CRVO central retinal vein occlusion; CRAO central retinal artery occlusion; BRAO branch retinal artery occlusion; RT retinal tear; SB scleral buckle; PPV pars plana vitrectomy; VH Vitreous hemorrhage; PRP panretinal laser photocoagulation; IVK intravitreal kenalog; VMT vitreomacular traction; MH Macular hole;  NVD neovascularization of the disc; NVE neovascularization elsewhere; AREDS age related eye disease study; ARMD age related macular degeneration; POAG primary open angle glaucoma; EBMD epithelial/anterior basement membrane dystrophy; ACIOL anterior chamber intraocular lens; IOL intraocular lens; PCIOL posterior chamber intraocular lens; Phaco/IOL phacoemulsification with intraocular lens placement; PRK photorefractive keratectomy; LASIK laser assisted in situ  keratomileusis; HTN hypertension; DM diabetes mellitus; COPD chronic obstructive pulmonary disease

## 2020-11-27 ENCOUNTER — Encounter (INDEPENDENT_AMBULATORY_CARE_PROVIDER_SITE_OTHER): Payer: Self-pay | Admitting: Ophthalmology

## 2020-11-27 ENCOUNTER — Ambulatory Visit (INDEPENDENT_AMBULATORY_CARE_PROVIDER_SITE_OTHER): Payer: Medicaid Other | Admitting: Ophthalmology

## 2020-11-27 ENCOUNTER — Other Ambulatory Visit: Payer: Self-pay

## 2020-11-27 DIAGNOSIS — H33322 Round hole, left eye: Secondary | ICD-10-CM

## 2020-11-27 DIAGNOSIS — H3581 Retinal edema: Secondary | ICD-10-CM | POA: Diagnosis not present

## 2020-11-27 DIAGNOSIS — H35412 Lattice degeneration of retina, left eye: Secondary | ICD-10-CM | POA: Diagnosis not present

## 2020-11-28 ENCOUNTER — Encounter (INDEPENDENT_AMBULATORY_CARE_PROVIDER_SITE_OTHER): Payer: Self-pay | Admitting: Ophthalmology

## 2022-10-23 ENCOUNTER — Encounter (HOSPITAL_BASED_OUTPATIENT_CLINIC_OR_DEPARTMENT_OTHER): Payer: Self-pay

## 2022-10-23 ENCOUNTER — Other Ambulatory Visit: Payer: Self-pay

## 2022-10-23 ENCOUNTER — Emergency Department (HOSPITAL_BASED_OUTPATIENT_CLINIC_OR_DEPARTMENT_OTHER)
Admission: EM | Admit: 2022-10-23 | Discharge: 2022-10-23 | Disposition: A | Discharge status: Home / Self Care | Payer: 59 | Source: Home / Self Care | Attending: Emergency Medicine | Admitting: Emergency Medicine

## 2022-10-23 ENCOUNTER — Emergency Department (HOSPITAL_BASED_OUTPATIENT_CLINIC_OR_DEPARTMENT_OTHER): Payer: 59

## 2022-10-23 DIAGNOSIS — R197 Diarrhea, unspecified: Secondary | ICD-10-CM | POA: Diagnosis not present

## 2022-10-23 DIAGNOSIS — R112 Nausea with vomiting, unspecified: Secondary | ICD-10-CM | POA: Diagnosis not present

## 2022-10-23 DIAGNOSIS — R1031 Right lower quadrant pain: Secondary | ICD-10-CM | POA: Diagnosis not present

## 2022-10-23 LAB — COMPREHENSIVE METABOLIC PANEL
ALT: 15 U/L (ref 0–44)
AST: 13 U/L — ABNORMAL LOW (ref 15–41)
Albumin: 4.5 g/dL (ref 3.5–5.0)
Alkaline Phosphatase: 78 U/L (ref 38–126)
Anion gap: 9 (ref 5–15)
BUN: 15 mg/dL (ref 6–20)
CO2: 26 mmol/L (ref 22–32)
Calcium: 9.1 mg/dL (ref 8.9–10.3)
Chloride: 105 mmol/L (ref 98–111)
Creatinine, Ser: 1.01 mg/dL (ref 0.61–1.24)
GFR, Estimated: >60 mL/min (ref >60)
Glucose, Bld: 111 mg/dL — ABNORMAL HIGH (ref 70–99)
Potassium: 3.9 mmol/L (ref 3.5–5.1)
Sodium: 140 mmol/L (ref 135–145)
Total Bilirubin: 0.5 mg/dL (ref 0.3–1.2)
Total Protein: 6.8 g/dL (ref 6.5–8.1)

## 2022-10-23 LAB — URINALYSIS, ROUTINE W REFLEX MICROSCOPIC
Bacteria, UA: NONE SEEN
Bilirubin Urine: NEGATIVE
Glucose, UA: NEGATIVE mg/dL
Ketones, ur: NEGATIVE mg/dL
Leukocytes,Ua: NEGATIVE
Nitrite: NEGATIVE
Protein, ur: NEGATIVE mg/dL
Specific Gravity, Urine: 1.025 (ref 1.005–1.030)
pH: 7 (ref 5.0–8.0)

## 2022-10-23 LAB — CBC
HCT: 46.1 % (ref 39.0–52.0)
Hemoglobin: 15.7 g/dL (ref 13.0–17.0)
MCH: 32.2 pg (ref 26.0–34.0)
MCHC: 34.1 g/dL (ref 30.0–36.0)
MCV: 94.5 fL (ref 80.0–100.0)
Platelets: 164 10*3/uL (ref 150–400)
RBC: 4.88 MIL/uL (ref 4.22–5.81)
RDW: 13.2 % (ref 11.5–15.5)
WBC: 8.8 10*3/uL (ref 4.0–10.5)
nRBC: 0 % (ref 0.0–0.2)

## 2022-10-23 LAB — LIPASE, BLOOD: Lipase: 24 U/L (ref 11–51)

## 2022-10-23 MED ORDER — IOHEXOL 300 MG/ML  SOLN
100.0000 mL | Freq: Once | INTRAMUSCULAR | Status: AC | PRN
  Administered 2022-10-23: 100 mL via INTRAVENOUS

## 2022-10-23 MED ORDER — ONDANSETRON 4 MG PO TBDP: 4.0000 mg | ORAL_TABLET | Freq: Three times a day (TID) | ORAL | Status: DC | PRN

## 2022-10-23 MED ORDER — ONDANSETRON HCL 4 MG PO TABS: 4.0000 mg | ORAL_TABLET | Freq: Four times a day (QID) | ORAL | 0 refills | Status: AC

## 2022-10-23 MED ORDER — ONDANSETRON HCL 4 MG PO TABS: 4.0000 mg | ORAL_TABLET | Freq: Four times a day (QID) | ORAL | 0 refills | Status: DC

## 2022-10-23 NOTE — ED Provider Notes (Signed)
[place] EMERGENCY DEPARTMENT AT [facility] Provider Note   CSN: [id number] Arrival date & time: 10/23/22  1623     History  Chief Complaint  Patient presents with   Abdominal Pain    Dennis Tran is a 37 y.o. male who presents to the ED concern for nausea, vomiting, diarrhea and RLQ abdominal pain x6 days after eating [employer].  Vomiting resolved 4 days ago. Patient stating that diarrhea started resolving yesterday.  Denies fever, chest pain, dyspnea, hematemesis, hematochezia, dysuria, hematuria.   Abdominal Pain      Home Medications Prior to Admission medications   Medication Sig Start Date End Date Taking? Authorizing Provider  ondansetron (ZOFRAN) 4 MG tablet Take 1 tablet (4 mg total) by mouth every 6 (six) hours for 3 days. 10/23/22 10/26/22 Yes Dorthy Cooler, PA-C      Allergies    Patient has no known allergies.    Review of Systems   Review of Systems  Gastrointestinal:  Positive for abdominal pain.    Physical Exam Updated Vital Signs BP 114/81 (BP Location: Right Arm)   Pulse 70   Temp 98.7 F (37.1 C) (Oral)   Resp 16   SpO2 100%  Physical Exam Vitals and nursing note reviewed.  Constitutional:      General: He is not in acute distress.    Appearance: He is not ill-appearing or toxic-appearing.  HENT:     Head: Normocephalic and atraumatic.     Mouth/Throat:     Mouth: Mucous membranes are moist.     Pharynx: No posterior oropharyngeal erythema.  Eyes:     General: No scleral icterus.       Right eye: No discharge.        Left eye: No discharge.     Conjunctiva/sclera: Conjunctivae normal.  Cardiovascular:     Rate and Rhythm: Normal rate and regular rhythm.     Pulses: Normal pulses.     Heart sounds: No murmur heard. Pulmonary:     Effort: Pulmonary effort is normal. No respiratory distress.     Breath sounds: Normal breath sounds. No wheezing, rhonchi or rales.  Abdominal:     General: Abdomen is flat. Bowel  sounds are normal. There is no distension.     Palpations: Abdomen is soft. There is no hepatomegaly or mass.     Comments: Tenderness to palpation of LUQ, RUQ, and RLQ.  Musculoskeletal:     Right lower leg: No edema.     Left lower leg: No edema.  Skin:    General: Skin is warm and dry.     Findings: No rash.  Neurological:     General: No focal deficit present.     Mental Status: He is alert. Mental status is at baseline.  Psychiatric:        Mood and Affect: Mood normal.     ED Results / Procedures / Treatments   Labs (all labs ordered are listed, but only abnormal results are displayed) Labs Reviewed  COMPREHENSIVE METABOLIC PANEL - Abnormal; Notable for the following components:      Result Value   Glucose, Bld 111 (*)    AST 13 (*)    All other components within normal limits  URINALYSIS, ROUTINE W REFLEX MICROSCOPIC - Abnormal; Notable for the following components:   Hgb urine dipstick TRACE (*)    All other components within normal limits  LIPASE, BLOOD  CBC    EKG None  Radiology CT ABDOMEN  PELVIS W CONTRAST  Result Date: 10/23/2022 CLINICAL DATA:  Right lower quadrant pain EXAM: CT ABDOMEN AND PELVIS WITH CONTRAST TECHNIQUE: Multidetector CT imaging of the abdomen and pelvis was performed using the standard protocol following bolus administration of intravenous contrast. RADIATION DOSE REDUCTION: This exam was performed according to the departmental dose-optimization program which includes automated exposure control, adjustment of the mA and/or kV according to patient size and/or use of iterative reconstruction technique. CONTRAST:  OMNIPAQUE IOHEXOL 300 MG/ML  SOLN COMPARISON:  None Available. FINDINGS: Lower chest: No acute abnormality Hepatobiliary: No focal hepatic abnormality. Gallbladder unremarkable. Pancreas: No focal abnormality or ductal dilatation. Spleen: No focal abnormality.  Normal size. Adrenals/Urinary Tract: No adrenal abnormality. No focal  renal abnormality. No stones or hydronephrosis. Urinary bladder is unremarkable. Stomach/Bowel: Normal appendix. Stomach, large and small bowel grossly unremarkable. Vascular/Lymphatic: No evidence of aneurysm or adenopathy. Reproductive: No visible focal abnormality. Other: No free fluid or free air. Musculoskeletal: No acute bony abnormality. IMPRESSION: Normal appendix. No acute findings in the abdomen or pelvis. Electronically Signed   By: Charlett Nose M.D.   On: 10/23/2022 18:02    Procedures Procedures    Medications Ordered in ED Medications  ondansetron (ZOFRAN-ODT) disintegrating tablet 4 mg (has no administration in time range)  iohexol (OMNIPAQUE) 300 MG/ML solution 100 mL (100 mLs Intravenous Contrast Given 10/23/22 1750)    ED Course/ Medical Decision Making/ A&P                                 Medical Decision Making Amount and/or Complexity of Data Reviewed Labs: ordered. Radiology: ordered.  Risk Prescription drug management.    This patient presents to the ED for concern of n/v/d and abdominal pain, this involves an extensive number of treatment options, and is a complaint that carries with it a high risk of complications and morbidity.  The differential diagnosis includes gastroenteritis, colitis, small bowel obstruction, appendicitis, cholecystitis, pancreatitis, nephrolithiasis, UTI, pyleonephritis, testicular torsion.   Co morbidities that complicate the patient evaluation  none   Lab Tests:  I Ordered, and personally interpreted labs.  The pertinent results include:   CBC with differential: No concern for anemia or leukocytosis CMP: no concern for electrolyte abnormality; no concern for kidney/liver damage Lipase: Within normal limits UA: Not concerning for infection   Imaging Studies ordered:  I ordered imaging studies including   CT Abd/Pelvis with contrast: evaluate for structural/surgical etiology of patients' severe abdominal pain.  I  independently visualized and interpreted imaging I agree with the radiologist interpretation    Problem List / ED Course / Critical interventions / Medication management  Patient presented for n/v/d and abdominal pain. Patient stating that vomiting resolved 4 days ago and diarrhea started resolving yesterday.  Afebrile with stable vitals.  Physical exam with tenderness to palpation of the RUQ, LUQ, and RLQ. UA without concern for infection.  Lipase within normal limits.  CBC without leukocytosis or anemia.  CMP without electrolyte abnormality or kidney/liver damage.  CT abdomen pelvis without acute concern. Patient refused Zofran in the emergency room but asked for a prescription just in case his nausea recurs in the next few days.  It appears that patient's symptoms are due to possible food poisoning or viral gastroenteritis that seems to be resolving.  No vomiting or diarrhea in the emergency room.  Educated patient on symptomatic treatment.  Patient verbalized understanding of plan. I have reviewed the  patients home medicines and have made adjustments as needed Patient was given return precautions. Patient stable for discharge at this time.  Patient verbalized understanding of plan.  Ddx: These are considered less likely due to history of present illness and physical exam. -colitis: Denies diarrhea, patient afebrile  -small bowel obstruction: Last BM yesterday  -appendicitis: CT without concern -cholecystitis: CT without concern -Pancreatitis: lipase within normal limits  -nephrolithiasis: Denies flank pain and urinary complaints  -UTI/pyelonephritis: Denies urinary complaints  - testicular torsion: pain does not radiate into testicles   Social Determinants of Health:  none           Final Clinical Impression(s) / ED Diagnoses Final diagnoses:  Nausea vomiting and diarrhea    Rx / DC Orders ED Discharge Orders          Ordered    ondansetron (ZOFRAN) 4 MG tablet   Every 6 hours        10/23/22 1846              Dorthy Cooler, PA-C 10/23/22 Carlis Stable    Benjiman Core, MD 10/23/22 2302

## 2022-10-23 NOTE — Discharge Instructions (Addendum)
It was a pleasure caring for you today. Lab and imaging was reassuring today. Seek emergency care if experiencing and new or worsening symptoms.

## 2022-10-23 NOTE — ED Notes (Signed)
Transported to CT 

## 2022-10-23 NOTE — ED Triage Notes (Signed)
He c/o lower right abd. Discomfort since Mon. (6 days). He states he had had n/v/d earlier last week "but that all cleared up now".

## 2022-10-23 NOTE — ED Notes (Signed)
Dc instructions reviewed with patient. Patient voiced understanding. Dc with belongings.  °
